# Patient Record
Sex: Male | Born: 2000 | Race: Black or African American | Hispanic: No | Marital: Single | Smoking: Never smoker
Health system: Southern US, Community
[De-identification: ages and names within clinical notes are randomized; demographics above are authoritative.]

## PROBLEM LIST (undated history)

## (undated) DIAGNOSIS — S62336A Displaced fracture of neck of fifth metacarpal bone, right hand, initial encounter for closed fracture: Secondary | ICD-10-CM

## (undated) DIAGNOSIS — S62314A Displaced fracture of base of fourth metacarpal bone, right hand, initial encounter for closed fracture: Secondary | ICD-10-CM

## (undated) DIAGNOSIS — J302 Other seasonal allergic rhinitis: Secondary | ICD-10-CM

## (undated) DIAGNOSIS — J45909 Unspecified asthma, uncomplicated: Secondary | ICD-10-CM

---
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
## 2019-12-14 IMAGING — DX DG CHEST 1V PORT
1 series · 1 of 1 positions shown · non-contrast
Comparison: Portable chest x-ray December 28, 2017 and CT scan of
the chest of December 20, 2017. Multiple rib fractures.

CLINICAL DATA: Status post trauma with liver laceration. Follow-up
pleural effusion.

EXAM:
PORTABLE CHEST 1 VIEW

[chest ap]
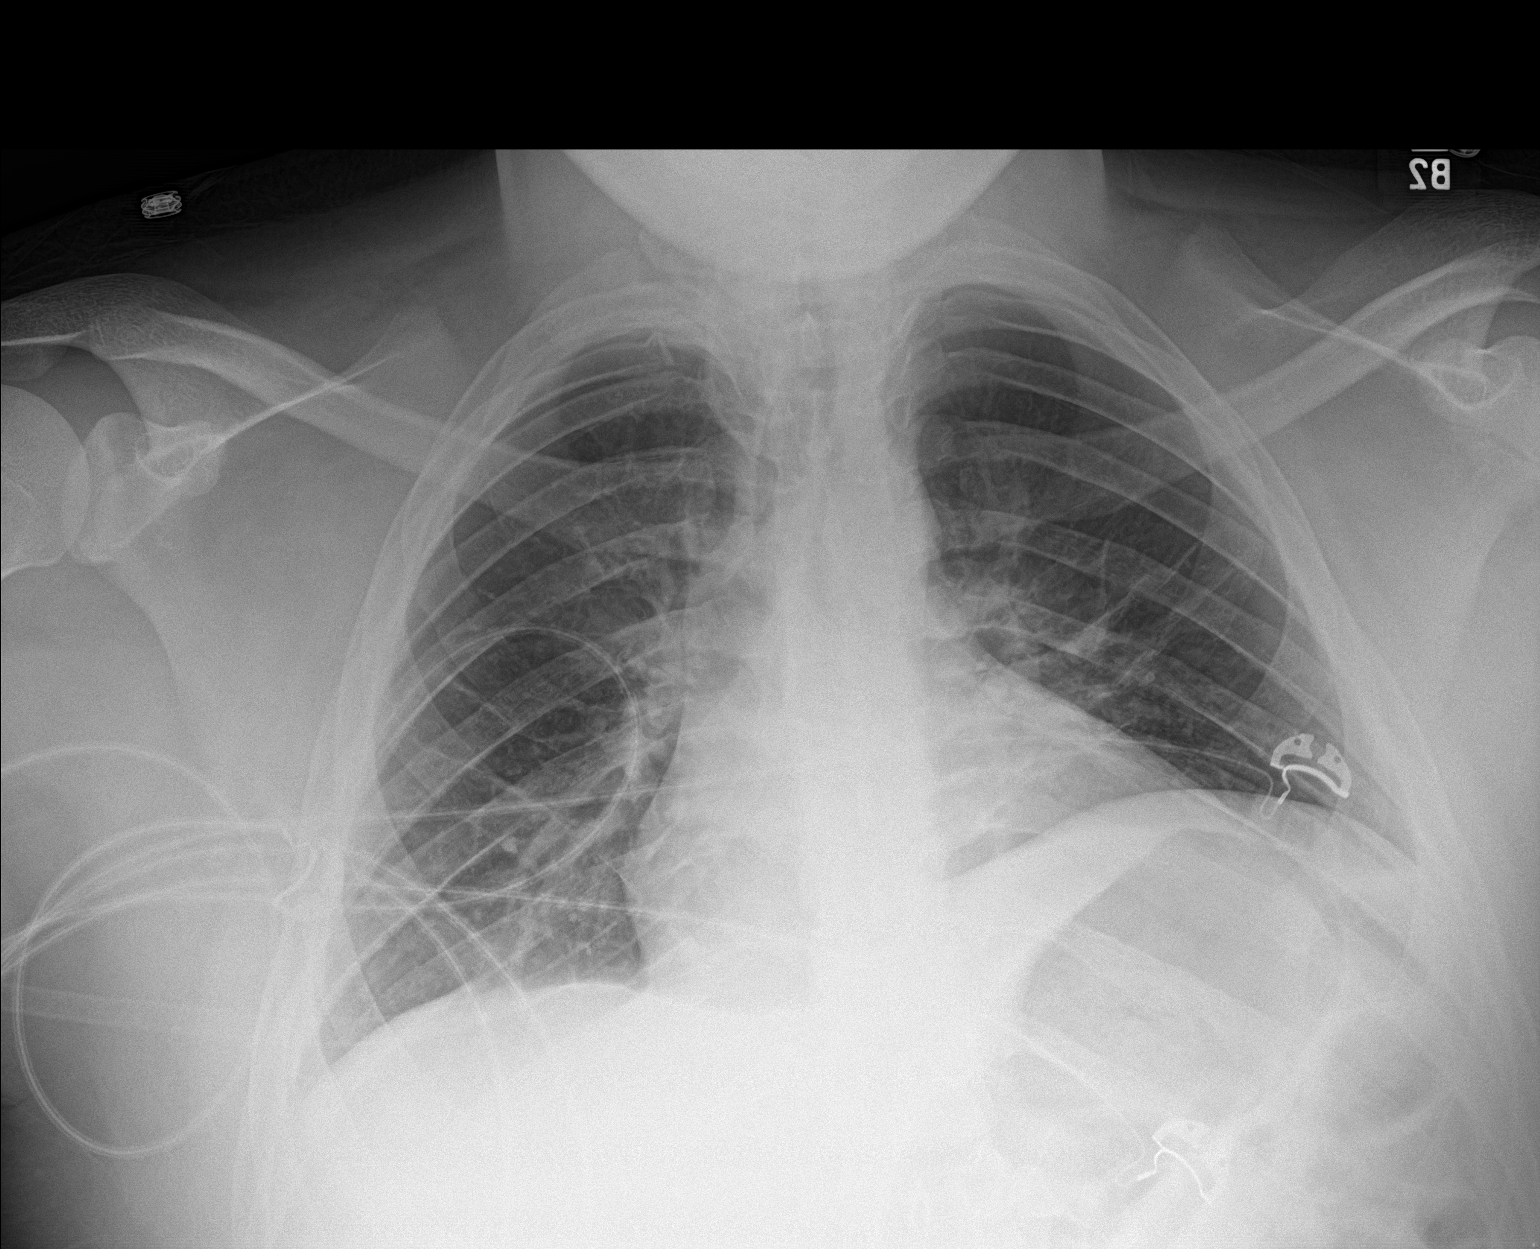

[1 of 1 positions shown; findings below may reference images not displayed]

FINDINGS: The right lung is well-expanded. There is no pneumothorax. Fractures
of the posteromedial aspects of the right third and fourth ribs are
present. No significant right pleural effusion is seen. The right
infrahilar region is less dense today. On the left there elevation
of the hemidiaphragm more conspicuous than on yesterday's study.
There is left perihilar and infrahilar linear increased density
which is not new. The heart is mildly enlarged but stable. The
pulmonary vascularity is not engorged. The mediastinum is normal in
width.
IMPRESSION: Improving appearance of the right lung base consistent with
decreased atelectasis or resolving pulmonary contusion. No
pneumothorax or pleural effusion on the right is observed.

Volume loss on the left slightly more conspicuous. Mild elevation of
the left hemidiaphragm. Stable perihilar and infrahilar subsegmental
atelectasis with small left pleural effusion.

## 2021-01-10 ENCOUNTER — Emergency Department (HOSPITAL_BASED_OUTPATIENT_CLINIC_OR_DEPARTMENT_OTHER)
Admission: EM | Admit: 2021-01-10 | Discharge: 2021-01-10 | Disposition: A | Discharge status: Home / Self Care | Payer: Medicaid Other | Attending: Emergency Medicine | Admitting: Emergency Medicine

## 2021-01-10 ENCOUNTER — Other Ambulatory Visit: Payer: Self-pay

## 2021-01-10 ENCOUNTER — Encounter (HOSPITAL_BASED_OUTPATIENT_CLINIC_OR_DEPARTMENT_OTHER): Payer: Self-pay | Admitting: Emergency Medicine

## 2021-01-10 DIAGNOSIS — Z91010 Allergy to peanuts: Secondary | ICD-10-CM | POA: Insufficient documentation

## 2021-01-10 DIAGNOSIS — J45909 Unspecified asthma, uncomplicated: Secondary | ICD-10-CM | POA: Insufficient documentation

## 2021-01-10 DIAGNOSIS — Z20822 Contact with and (suspected) exposure to covid-19: Secondary | ICD-10-CM | POA: Insufficient documentation

## 2021-01-10 DIAGNOSIS — J069 Acute upper respiratory infection, unspecified: Secondary | ICD-10-CM

## 2021-01-10 LAB — SARS CORONAVIRUS 2 (TAT 6-24 HRS): SARS Coronavirus 2: NEGATIVE

## 2021-01-10 NOTE — ED Triage Notes (Signed)
Pt reports sinus congestion, runny nose and productive cough x 1 week; pt is not vaccinated for COVID; pt took Sudafed 1730

## 2021-01-10 NOTE — ED Notes (Signed)
EDP at bedside  

## 2021-01-10 NOTE — ED Provider Notes (Signed)
[facility] EMERGENCY DEPARTMENT Provider Note   CSN: [id number] Arrival date & time: 01/10/21  8768     History Chief Complaint  Patient presents with  . Nasal Congestion    Trevor Obrien is a 20 y.o. male.  The history is provided by the patient.  Cough Severity:  Moderate Onset quality:  Gradual Duration:  1 week Timing:  Intermittent Progression:  Unchanged Chronicity:  New Relieved by:  Nothing Worsened by:  Nothing Ineffective treatments:  Decongestant Associated symptoms: sinus congestion   Associated symptoms: no chest pain, no fever and no shortness of breath    Patient reports cough, congestion for the past week.  No chest pain/shortness of breath.  No fevers or vomiting.  He is unvaccinated for COVID-19    Past Medical History:  Diagnosis Date  . Asthma   . Closed displaced fracture of base of fourth metacarpal bone of right hand 12/21/2017  . Closed displaced fracture of neck of right fifth metacarpal bone 12/21/2017  . Seasonal allergies     Patient Active Problem List   Diagnosis Date Noted  . Closed displaced fracture of neck of right fifth metacarpal bone 12/21/2017  . Closed displaced fracture of base of fourth metacarpal bone of right hand 12/21/2017  . Liver laceration, minor 12/20/2017    History reviewed. No pertinent surgical history.     No family history on file.  Social History   Tobacco Use  . Smoking status: Never Smoker  . Smokeless tobacco: Never Used  Vaping Use  . Vaping Use: Never used  Substance Use Topics  . Alcohol use: No  . Drug use: No    Home Medications Prior to Admission medications   Medication Sig Start Date End Date Taking? Authorizing Provider  albuterol (PROVENTIL HFA;VENTOLIN HFA) 108 (90 Base) MCG/ACT inhaler Inhale 2 puffs into the lungs every 6 (six) hours as needed for wheezing or shortness of breath.    [provider]  metoprolol tartrate (LOPRESSOR) 25 MG tablet Take 1  tablet (25 mg total) by mouth 2 (two) times daily. 12/30/17   Meuth, Brooke A, PA-C  montelukast (SINGULAIR) 10 MG tablet Take 10 mg by mouth as needed.    [provider]  montelukast (SINGULAIR) 5 MG chewable tablet Chew 5 mg by mouth at bedtime.    [provider]    Allergies    Peanut-containing drug products  Review of Systems   Review of Systems  Constitutional: Negative for fever.  Respiratory: Positive for cough. Negative for shortness of breath.   Cardiovascular: Negative for chest pain.  Gastrointestinal: Negative for vomiting.    Physical Exam Updated Vital Signs BP (!) 157/100 (BP Location: Right Arm)   Pulse 79   Temp 97.8 F (36.6 C) (Oral)   Resp 19   Ht 1.829 m (6')   Wt 117.9 kg   SpO2 99%   BMI 35.26 kg/m   Physical Exam CONSTITUTIONAL: Well developed/well nourished HEAD: Normocephalic/atraumatic EYES: EOMI/PERRL ENMT: Mucous membranes moist NECK: supple no meningeal signs CV: S1/S2 noted, no murmurs/rubs/gallops noted LUNGS: Lungs are clear to auscultation bilaterally, no apparent distress ABDOMEN: soft, nontender NEURO: Pt is awake/alert/appropriate, moves all extremitiesx4.  No facial droop.   EXTREMITIES:  full ROM SKIN: warm, color normal PSYCH: no abnormalities of mood noted, alert and oriented to situation  ED Results / Procedures / Treatments   Labs (all labs ordered are listed, but only abnormal results are displayed) Labs Reviewed  SARS CORONAVIRUS 2 (  TAT 6-24 HRS)    EKG None  Radiology No results found.  Procedures Procedures   Medications Ordered in ED Medications - No data to display  ED Course  I have reviewed the triage vital signs and the nursing notes.  Pertinent labs results that were available during my care of the patient were reviewed by me and considered in my medical decision making (see chart for details).    MDM Rules/Calculators/A&P                          Patient is well-appearing.   Patient has been taking Sudafed which could explain his high blood pressure. Lung sounds clear, no hypoxia Will discharge  Trevor Obrien was evaluated in Emergency Department on 01/10/2021 for the symptoms described in the history of present illness. He was evaluated in the context of the global COVID-19 pandemic, which necessitated consideration that the patient might be at risk for infection with the SARS-CoV-2 virus that causes COVID-19. Institutional protocols and algorithms that pertain to the evaluation of patients at risk for COVID-19 are in a state of rapid change based on information released by regulatory bodies including the CDC and federal and state organizations. These policies and algorithms were followed during the patient's care in the ED.  Final Clinical Impression(s) / ED Diagnoses Final diagnoses:  Viral URI with cough    Rx / DC Orders ED Discharge Orders    None       Zadie Rhine, MD 01/10/21 [phone]

## 2023-05-28 ENCOUNTER — Emergency Department (HOSPITAL_BASED_OUTPATIENT_CLINIC_OR_DEPARTMENT_OTHER)
Admission: EM | Admit: 2023-05-28 | Discharge: 2023-05-28 | Disposition: A | Discharge status: Home / Self Care | Payer: Self-pay | Attending: Emergency Medicine | Admitting: Emergency Medicine

## 2023-05-28 ENCOUNTER — Other Ambulatory Visit: Payer: Self-pay

## 2023-05-28 ENCOUNTER — Encounter (HOSPITAL_BASED_OUTPATIENT_CLINIC_OR_DEPARTMENT_OTHER): Payer: Self-pay | Admitting: Emergency Medicine

## 2023-05-28 DIAGNOSIS — I10 Essential (primary) hypertension: Secondary | ICD-10-CM | POA: Insufficient documentation

## 2023-05-28 DIAGNOSIS — Z91010 Allergy to peanuts: Secondary | ICD-10-CM | POA: Insufficient documentation

## 2023-05-28 DIAGNOSIS — H1031 Unspecified acute conjunctivitis, right eye: Secondary | ICD-10-CM | POA: Insufficient documentation

## 2023-05-28 DIAGNOSIS — Z79899 Other long term (current) drug therapy: Secondary | ICD-10-CM | POA: Insufficient documentation

## 2023-05-28 MED ORDER — AMLODIPINE BESYLATE 10 MG PO TABS: 10.0000 mg | ORAL_TABLET | Freq: Every day | ORAL | 1 refills | Status: AC

## 2023-05-28 MED ORDER — CLONIDINE HCL 0.1 MG PO TABS
0.1000 mg | ORAL_TABLET | Freq: Once | ORAL | Status: AC
  Administered 2023-05-28: 0.1 mg via ORAL
  Filled 2023-05-28: qty 1

## 2023-05-28 MED ORDER — POLYMYXIN B-TRIMETHOPRIM 10000-0.1 UNIT/ML-% OP SOLN: 1.0000 [drp] | OPHTHALMIC | 0 refills | Status: AC

## 2023-05-28 NOTE — Discharge Instructions (Signed)
Return to the ED with any new or worsening signs or symptoms Please treat right eye swelling and discomfort with conservative measures.  Please place warm compresses on your right eye and utilize eyedrops.  If these measures do not decrease symptoms, you may utilize antibiotic eyedrops I prescribed you. Please read the attached guides concerning blood pressure as well as bacterial conjunctivitis Please follow-up with PCP which I referred you to.  Please call and make an appointment to be seen. Please begin taking 10 mg amlodipine once daily until seen by PCP

## 2023-05-28 NOTE — ED Notes (Signed)
Reported repeat BP to PA. PA cleared pt for discharge.

## 2023-05-28 NOTE — ED Notes (Signed)
Discharge instructions provided by edp were discussed with pt. Pt verbalized understanding with no additional questions at this time. Pt to go home with family at bedside.  

## 2023-05-28 NOTE — ED Provider Notes (Signed)
[place] EM[facility]GENCY DEPARTMENT AT [facility] Provider Note   CSN: [id number] Arrival date & time: 05/28/23  1825     History  Chief Complaint  Patient presents with   Eye Problem    Trevor Obrien is a 22 y.o. male who presents to the ED for evaluation of right eye swelling, itchiness and discharge.  The patient reports he woke up with symptoms this morning.  He denies known sick contacts.  Denies children in the household.  Denies any URI symptoms.  Denies contact usage.  Denies trouble seeing or fevers.   Eye Problem Associated symptoms: discharge, itching and redness   Associated symptoms: no photophobia        Home Medications Prior to Admission medications   Medication Sig Start Date End Date Taking? Authorizing Provider  amLODipine (NORVASC) 10 MG tablet Take 1 tablet (10 mg total) by mouth daily. 05/28/23  Yes Al Decant, PA-C  trimethoprim-polymyxin b (POLYTRIM) ophthalmic solution Place 1 drop into the right eye every 4 (four) hours. 05/28/23  Yes Al Decant, PA-C  albuterol (PROVENTIL HFA;VENTOLIN HFA) 108 (90 Base) MCG/ACT inhaler Inhale 2 puffs into the lungs every 6 (six) hours as needed for wheezing or shortness of breath.    [provider]  metoprolol tartrate (LOPRESSOR) 25 MG tablet Take 1 tablet (25 mg total) by mouth 2 (two) times daily. 12/30/17   Meuth, Brooke A, PA-C  montelukast (SINGULAIR) 10 MG tablet Take 10 mg by mouth as needed.    [provider]  montelukast (SINGULAIR) 5 MG chewable tablet Chew 5 mg by mouth at bedtime.    [provider]      Allergies    Peanut-containing drug products    Review of Systems   Review of Systems  Constitutional:  Negative for fever.  Eyes:  Positive for discharge, redness and itching. Negative for photophobia.  All other systems reviewed and are negative.   Physical Exam Updated Vital Signs BP (!) 187/125 (BP Location: Left Arm)   Pulse 80   Temp  97.9 F (36.6 C)   Resp 19   Ht 6\' 1"  (1.854 m)   Wt 127 kg   SpO2 100%   BMI 36.94 kg/m  Physical Exam Vitals and nursing note reviewed.  Constitutional:      General: He is not in acute distress.    Appearance: He is well-developed.  HENT:     Head: Normocephalic and atraumatic.  Eyes:     General: Lids are everted, no foreign bodies appreciated.        Right eye: Discharge present.     Conjunctiva/sclera:     Right eye: Right conjunctiva is injected.     Comments: Right sclera injected.  Watery discharge noted to right eye.  Pupils PERRL, EOMs intact.  No periorbital erythema.  Cardiovascular:     Rate and Rhythm: Normal rate and regular rhythm.     Heart sounds: No murmur heard. Pulmonary:     Effort: Pulmonary effort is normal. No respiratory distress.     Breath sounds: Normal breath sounds.  Abdominal:     Palpations: Abdomen is soft.     Tenderness: There is no abdominal tenderness.  Musculoskeletal:        General: No swelling.     Cervical back: Neck supple.  Skin:    General: Skin is warm and dry.     Capillary Refill: Capillary refill takes less than 2 seconds.  Neurological:  Mental Status: He is alert.  Psychiatric:        Mood and Affect: Mood normal.     ED Results / Procedures / Treatments   Labs (all labs ordered are listed, but only abnormal results are displayed) Labs Reviewed - No data to display  EKG None  Radiology No results found.  Procedures Procedures    Medications Ordered in ED Medications  cloNIDine (CATAPRES) tablet 0.1 mg (has no administration in time range)    ED Course/ Medical Decision Making/ A&P  Medical Decision Making Risk Prescription drug management.   22 year old no presents to the ED for evaluation.  Please see HPI for further details.  On examination patient right I is injected, there is watery discharge.  His pupils are PERRL, his EOMs are intact.  There is no periorbital erythema.  Most likely  patient symptoms due to conjunctivitis.  Patient denies URI symptoms.  He is requesting antibiotics.  I did explain to the patient that antibiotics most likely will not be of use in the situation due to the fact that this is most likely viral in etiology.  He persists in asking for antibiotics.  I advised the patient that I will send antibiotics and but I would like for him to attempted control symptoms conservatively with warm compresses and symptomatic control at home.  He is in agreement with this plan.  He will follow-up with his PCP for reevaluation and pick up antibiotics if his symptoms or not improving in the next few days.  All questions answered to the patient satisfaction.  He is stable to discharge home.   Prior to discharge, patient blood pressure noted to be elevated.  The patient denies history of hypertension.  Denies taking blood pressure medication.  He denies chest pain, shortness of breath, headache or blurred vision.  Will pick the patient chart, he does appear to have elevated blood pressure readings in the past but not consistent or I am able to definitively conclude that he has hypertension.  Will give him clonidine here.  Will discharge with 10 mg amlodipine and PCP referral.  Discussed with Dr. Renaye Rakers.  Final Clinical Impression(s) / ED Diagnoses Final diagnoses:  Acute conjunctivitis of right eye, unspecified acute conjunctivitis type  Asymptomatic hypertension    Rx / DC Orders ED Discharge Orders          Ordered    trimethoprim-polymyxin b (POLYTRIM) ophthalmic solution  Every 4 hours        05/28/23 2111    amLODipine (NORVASC) 10 MG tablet  Daily        05/28/23 2119              Clent Ridges 05/28/23 2119    Terald Sleeper, MD 05/28/23 2330

## 2023-05-28 NOTE — ED Triage Notes (Signed)
Pt states that he woke up with his rt eye red, swollen, itchy and watery; took Benadryl and Visine and still having symptoms, having difficulty opening eye in triage, red & swollen on assessment
